# Patient Record
Sex: Male | Born: 1990 | Race: White | Hispanic: No | Marital: Single | Smoking: Never smoker
Health system: Southern US, Community
[De-identification: ages and names within clinical notes are randomized; demographics above are authoritative.]

## PROBLEM LIST (undated history)

## (undated) DIAGNOSIS — F909 Attention-deficit hyperactivity disorder, unspecified type: Secondary | ICD-10-CM

## (undated) HISTORY — DX: Attention-deficit hyperactivity disorder, unspecified type: F90.9

---

## 2014-11-13 ENCOUNTER — Other Ambulatory Visit: Payer: Self-pay | Admitting: Family Medicine

## 2014-11-13 DIAGNOSIS — Z8249 Family history of ischemic heart disease and other diseases of the circulatory system: Secondary | ICD-10-CM

## 2014-11-20 ENCOUNTER — Ambulatory Visit
Admission: RE | Admit: 2014-11-20 | Discharge: 2014-11-20 | Disposition: A | Discharge status: Home / Self Care | Payer: BLUE CROSS/BLUE SHIELD | Source: Ambulatory Visit | Attending: Family Medicine | Admitting: Family Medicine

## 2014-11-20 DIAGNOSIS — Z8249 Family history of ischemic heart disease and other diseases of the circulatory system: Secondary | ICD-10-CM

## 2014-11-22 ENCOUNTER — Ambulatory Visit
Admission: RE | Admit: 2014-11-22 | Discharge: 2014-11-22 | Disposition: A | Discharge status: Home / Self Care | Payer: BLUE CROSS/BLUE SHIELD | Source: Ambulatory Visit | Attending: Family Medicine | Admitting: Family Medicine

## 2014-11-22 DIAGNOSIS — Z8249 Family history of ischemic heart disease and other diseases of the circulatory system: Secondary | ICD-10-CM

## 2017-12-29 DIAGNOSIS — Z79899 Other long term (current) drug therapy: Secondary | ICD-10-CM | POA: Diagnosis not present

## 2018-03-28 DIAGNOSIS — Z79899 Other long term (current) drug therapy: Secondary | ICD-10-CM | POA: Diagnosis not present

## 2018-06-28 DIAGNOSIS — Z79899 Other long term (current) drug therapy: Secondary | ICD-10-CM | POA: Diagnosis not present

## 2018-10-07 DIAGNOSIS — Z79899 Other long term (current) drug therapy: Secondary | ICD-10-CM | POA: Diagnosis not present

## 2018-12-19 DIAGNOSIS — S29011A Strain of muscle and tendon of front wall of thorax, initial encounter: Secondary | ICD-10-CM | POA: Diagnosis not present

## 2019-04-03 ENCOUNTER — Ambulatory Visit (INDEPENDENT_AMBULATORY_CARE_PROVIDER_SITE_OTHER): Payer: Managed Care, Other (non HMO) | Admitting: Family Medicine

## 2019-04-03 ENCOUNTER — Encounter: Payer: Self-pay | Admitting: Family Medicine

## 2019-04-03 ENCOUNTER — Other Ambulatory Visit: Payer: Self-pay

## 2019-04-03 DIAGNOSIS — F909 Attention-deficit hyperactivity disorder, unspecified type: Secondary | ICD-10-CM | POA: Diagnosis not present

## 2019-04-03 DIAGNOSIS — F322 Major depressive disorder, single episode, severe without psychotic features: Secondary | ICD-10-CM | POA: Diagnosis not present

## 2019-04-03 MED ORDER — ESCITALOPRAM OXALATE 10 MG PO TABS: 10.0000 mg | ORAL_TABLET | Freq: Every day | ORAL | 1 refills | Status: AC

## 2019-04-03 NOTE — Progress Notes (Signed)
Virtual Visit via Video Note   I connected with Christopher Miles on 04/03/19 at  4:30 PM EDT by a video enabled telemedicine application and verified that I am speaking with the correct person using two identifiers.  Location patient: home Location provider:work office Persons participating in the virtual visit: patient, provider  I discussed the limitations of evaluation and management by telemedicine and the availability of in person appointments. The patient expressed understanding and agreed to proceed.  Chief Complaint  Patient presents with  . Establish Care  . Depression    HPI: Christopher Miles is a 28 yo male establishing care today and c/o possible depression. Former PCP: N/A Last CPE: > 3 years.  He has noted symptoms for the past 3 months but worse for the past 2 weeks. 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 He feels "horrible" he has "a feeling in my stomach, it is hard to explain." Feeling down, having trouble sleeping, "off of my routine." He feels like he is going to be 28 years old and he has not sense of achievement.  Sleeping for about 3 hours. + Fatigue and decreased appetite, he has noticed some weight loss.  Denies prior hx of depression. Hx of ADHD, he follows with Dr Elisabeth MostStevenson and currently on Vyvanse 70 mg daily. He stopped taking his ADHD medication for a few days, resumed it recently.  He lives with his girlfriend. Negative for family history of psychiatric disorders.   Depression screen PHQ 2/9 04/03/2019  Decreased Interest 3  Down, Depressed, Hopeless 3  PHQ - 2 Score 6  Altered sleeping 3  Tired, decreased energy 3  Change in appetite 3  Feeling bad or failure about yourself  3  Trouble concentrating 3  Moving slowly or fidgety/restless 3  Suicidal thoughts 0  PHQ-9 Score 24  Difficult doing work/chores Extremely dIfficult    He denies headache, visual changes, sore  throat, palpitation, chest pain, dyspnea, abdominal pain, nausea, vomiting, changes in bowel habits, tremor, or dizziness.  ROS: See pertinent positives and negatives per HPI.  Past Medical History:  Diagnosis Date  . ADHD    History reviewed. No pertinent surgical history.  Family History  Problem Relation Age of Onset  . Depression Neg Hx   . Anxiety disorder Neg Hx     Social History   Socioeconomic History  . Marital status: Single    Spouse name: Not on file  . Number of children: Not on file  . Years of education: Not on file  . Highest education level: Not on file  Occupational History  . Not on file  Social Needs  . Financial resource strain: Not on file  . Food insecurity    Worry: Not on file    Inability: Not on file  . Transportation needs    Medical: Not on file    Non-medical: Not on file  Tobacco Use  . Smoking status: Never Smoker  . Smokeless tobacco: Never Used  Substance and Sexual Activity  . Alcohol use: Not on file  . Drug use: Not Currently  . Sexual activity: Not on file  Lifestyle  . Physical activity    Days per week: Not on file    Minutes per session: Not on file  . Stress: Not on file  Relationships  . Social Musicianconnections    Talks on phone: Not on file    Gets together: Not on  file    Attends religious service: Not on file    Active member of club or organization: Not on file    Attends meetings of clubs or organizations: Not on file    Relationship status: Not on file  . Intimate partner violence    Fear of current or ex partner: Not on file    Emotionally abused: Not on file    Physically abused: Not on file    Forced sexual activity: Not on file  Other Topics Concern  . Not on file  Social History Narrative  . Not on file    Current Outpatient Medications:  .  escitalopram (LEXAPRO) 10 MG tablet, Take 1 tablet (10 mg total) by mouth daily., Disp: 30 tablet, Rfl: 1  EXAM:  VITALS per patient if  applicable:N/A  GENERAL: alert, oriented, appears well and in no acute distress  HEENT: atraumatic, normocephalic,conjunctiva clear, no obvious facial abnormalities on inspection.  NECK: normal movements of the head and neck  LUNGS: on inspection no signs of respiratory distress, breathing rate appears normal, no obvious gross SOB, gasping or wheezing  CV: no obvious cyanosis  MS: moves all visible extremities without noticeable abnormality  PSYCH/NEURO: pleasant and cooperative, no obvious depression,+ anxious. Speech and thought processing grossly intact  ASSESSMENT AND PLAN:  Discussed the following assessment and plan:   ADHD Side effects of Vyvanse discussed. He has an appt with Dr Johnnye Sima in a couple weeks.  Depression, major, single episode, severe ([place]) New problem.  After discussion of pharmacologic options as well as side effects, he agrees with trying Lexapro 10 mg daily. Also recommend psychotherapy. Clearly instructed about warning signs. Follow-up in 6 weeks, before if needed.   Face to face virtual visit 30 min. Applied PHQ-9 questionnaire  We discussed physiopathology and role that serotonin plays in the disease/symptomatology.  I discussed the assessment and treatment plan with the patient. He was provided an opportunity to ask questions and all were answered. He agreed with the plan and demonstrated an understanding of the instructions.   The patient was advised to call back or seek an in-person evaluation if the symptoms worsen or if the condition fails to improve as anticipated.  Return in about 6 weeks (around 05/15/2019) for depression.    Shynice Sigel [place], MD

## 2019-04-03 NOTE — Assessment & Plan Note (Addendum)
New problem.  After discussion of pharmacologic options as well as side effects, he agrees with trying Lexapro 10 mg daily. Also recommend psychotherapy. Clearly instructed about warning signs. Follow-up in 6 weeks, before if needed.

## 2019-04-03 NOTE — Assessment & Plan Note (Signed)
Side effects of Vyvanse discussed. He has an appt with Dr Johnnye Sima in a couple weeks.

## 2020-09-04 ENCOUNTER — Encounter: Payer: Self-pay | Admitting: Orthopaedic Surgery

## 2020-09-04 ENCOUNTER — Other Ambulatory Visit: Payer: Self-pay

## 2020-09-04 ENCOUNTER — Ambulatory Visit (INDEPENDENT_AMBULATORY_CARE_PROVIDER_SITE_OTHER): Payer: 59 | Admitting: Orthopaedic Surgery

## 2020-09-04 ENCOUNTER — Ambulatory Visit (INDEPENDENT_AMBULATORY_CARE_PROVIDER_SITE_OTHER): Payer: 59

## 2020-09-04 VITALS — Ht 71.0 in | Wt 180.0 lb

## 2020-09-04 DIAGNOSIS — G8929 Other chronic pain: Secondary | ICD-10-CM

## 2020-09-04 DIAGNOSIS — M25511 Pain in right shoulder: Secondary | ICD-10-CM

## 2020-09-04 NOTE — Progress Notes (Signed)
Office Visit Note   Patient: Christopher Miles           Date of Birth: 11-04-1990           MRN: [id number] Visit Date: 09/04/2020              Requested by: No referring provider defined for this encounter. PCP: No primary care provider on file.   Assessment & Plan: Visit Diagnoses:  1. Chronic right shoulder pain     Plan: Christopher Miles relates initial onset of right shoulder discomfort a probably 8 to 10 years ago.  He notes initial onset of pain after "digging a number of holes".  He was initially treated with steroids and told he may have a rotator cuff tear.  The pain eventually resolved but notes that he has had recurrent episodes of pain on an episodic basis.  Most of the time the pain will last a week but on this occasion its been uncomfortable for at least a month.  No recurrent injury or trauma.  He has a feeling of something "crunching" and has difficulty lifting weights sleeping and even feeling of weakness with the right upper extremity.  He is right-hand dominant.  He has not had any history of shoulder dislocation.  I suspect he might have a tear of the labrum and will order an MRI arthrogram  Follow-Up Instructions: Return After MRI arthrogram right shoulder.   Orders:  Orders Placed This Encounter  Procedures  . XR Shoulder Right  . Arthrogram  . MR SHOULDER RIGHT W CONTRAST   No orders of the defined types were placed in this encounter.     Procedures: No procedures performed   Clinical Data: No additional findings.   Subjective: Chief Complaint  Patient presents with  . Right Shoulder - Pain  Patient presents today for his right shoulder. 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 He developed pain in his shoulder then and was diagnosed with a rotator cuff tear without further diagnostic testing. He was given oral prednisone and it seemed to get better. 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 This time it has been worse and he has noticed a "crunching" in his shoulder. He has pain with putting on and off his shirt. He cannot sleep well and definitely cannot sleep on his right side. He is right hand dominant. He has tried Ibuprofen, but states that it does not help. He states that his shoulder pain has now limited his ability to work out and pull his bow back for hunting.  No history of shoulder dislocation or subluxation but does feel something occasionally "slip".  No neck pain.  No history of numbness or tingling  HPI  Review of Systems   Objective: Vital Signs: Ht 5\' 11"  (1.803 m)   Wt 180 lb (81.6 kg)   BMI 25.10 kg/m   Physical Exam Constitutional:      Appearance: He is well-developed.  Eyes:     Pupils: Pupils are equal, round, and reactive to light.  Pulmonary:     Effort: Pulmonary effort is normal.  Skin:    General: Skin is warm and dry.  Neurological:     Mental Status: He is alert and oriented to person, place, and time.  Psychiatric:        Behavior: Behavior normal.     Ortho Exam awake alert and  oriented x3.  No acute distress.  Able to actively place his right arm quickly overhead without loss of motion.  Some discomfort along the glenohumeral joint anteriorly with internal rotation.  No popping or clicking or grating.  Good grip and release.  Biceps intact.  No pain at the [facility] joint or the subacromial region.  No apprehension with motion.  No obvious atrophy.  Good grip and release  Specialty Comments:  No specialty comments available.  Imaging: XR Shoulder Right  Result Date: 09/04/2020 Films of the right shoulder taken 3 projections.  There is no evidence of ectopic calcification or acute change.  Humeral head is centered about the glenoid.  Normal space between the humeral head and the acromion.  No obvious degenerative changes at the [facility] joint.  Type I acromion.    PMFS History: Patient Active Problem List   Diagnosis  Date Noted  . Pain in right shoulder 09/04/2020  . ADHD 04/03/2019  . Depression, major, single episode, severe (HCC) 04/03/2019   Past Medical History:  Diagnosis Date  . ADHD     Family History  Problem Relation Age of Onset  . Depression Neg Hx   . Anxiety disorder Neg Hx     History reviewed. No pertinent surgical history. Social History   Occupational History  . Not on file  Tobacco Use  . Smoking status: Never Smoker  . Smokeless tobacco: Never Used  Substance and Sexual Activity  . Alcohol use: Not on file  . Drug use: Not Currently  . Sexual activity: Not on file

## 2020-09-19 ENCOUNTER — Ambulatory Visit
Admission: RE | Admit: 2020-09-19 | Discharge: 2020-09-19 | Disposition: A | Discharge status: Home / Self Care | Payer: 59 | Source: Ambulatory Visit | Attending: Orthopaedic Surgery | Admitting: Orthopaedic Surgery

## 2020-09-19 DIAGNOSIS — G8929 Other chronic pain: Secondary | ICD-10-CM

## 2020-09-19 MED ORDER — IOPAMIDOL (ISOVUE-M 200) INJECTION 41%
12.0000 mL | Freq: Once | INTRAMUSCULAR | Status: AC
  Administered 2020-09-19: 12 mL via INTRA_ARTICULAR

## 2020-10-02 ENCOUNTER — Other Ambulatory Visit: Payer: Self-pay

## 2020-10-02 ENCOUNTER — Ambulatory Visit (INDEPENDENT_AMBULATORY_CARE_PROVIDER_SITE_OTHER): Payer: 59 | Admitting: Orthopaedic Surgery

## 2020-10-02 ENCOUNTER — Encounter: Payer: Self-pay | Admitting: Orthopaedic Surgery

## 2020-10-02 VITALS — Ht 71.0 in | Wt 180.0 lb

## 2020-10-02 DIAGNOSIS — M25511 Pain in right shoulder: Secondary | ICD-10-CM | POA: Diagnosis not present

## 2020-10-02 DIAGNOSIS — G8929 Other chronic pain: Secondary | ICD-10-CM | POA: Diagnosis not present

## 2020-10-02 NOTE — Progress Notes (Signed)
Office Visit Note   Patient: Christopher Miles           Date of Birth: 10/25/1990           MRN: [id number] Visit Date: 10/02/2020              Requested by: No referring provider defined for this encounter. PCP: Patient, No Pcp Per   Assessment & Plan: Visit Diagnoses:  1. Chronic right shoulder pain     Plan: Christopher Miles had an MRI arthrogram of his right shoulder demonstrating mild tendinosis of the infra and supraspinatus tendons and mild arthritis of the [facility] joint.  He still having quite a bit of trouble with overhead activity and any lifting with a sensation of something slipping out of place.  There was no evidence of a labral tear although they noted the exam was somewhat limited because of the lack of fluid.  My concern is that he is experiencing subluxation based on his symptoms.  After much discussion I think it is worth having Christopher Miles evaluate him.  He has had compromise in episodic pain for so long that I am not sure that an injection or therapy will make that much of a difference await Dr. Diamantina Providence evaluation  Follow-Up Instructions: Return Will refer to Christopher Miles.   Orders:  Orders Placed This Encounter  Procedures  . Ambulatory referral to Orthopedic Surgery   No orders of the defined types were placed in this encounter.     Procedures: No procedures performed   Clinical Data: No additional findings.   Subjective: Chief Complaint  Patient presents with  . Right Shoulder - Follow-up    MRI review  Patient presents today for follow up on his right shoulder. He had an MRI performed on 09/19/20. He is here today to go over those results.  No change in symptoms.  Has difficulty with overhead activity and and even working out at the gym.  He has a sensation of something "coming out of place along the anterior aspect of his shoulder.  Has never had a true shoulder dislocation.  Remote injury years ago  HPI  Review of Systems   Objective: Vital Signs: Ht 5\' 11"   (1.803 m)   Wt 180 lb (81.6 kg)   BMI 25.10 kg/m   Physical Exam Constitutional:      Appearance: He is well-developed and well-nourished.  HENT:     Mouth/Throat:     Mouth: Oropharynx is clear and moist.  Eyes:     Extraocular Movements: EOM normal.     Pupils: Pupils are equal, round, and reactive to light.  Pulmonary:     Effort: Pulmonary effort is normal.  Skin:    General: Skin is warm and dry.  Neurological:     Mental Status: He is alert and oriented to person, place, and time.  Psychiatric:        Mood and Affect: Mood and affect normal.        Behavior: Behavior normal.     Ortho Exam some apprehension with external rotation and abduction right shoulder.  No localized areas of tenderness.  No pain at the [facility] joint.  Minimal impingement symptoms.  Good grip and release.  Biceps intact.  No weakness  Specialty Comments:  No specialty comments available.  Imaging: No results found.   PMFS History: Patient Active Problem List   Diagnosis Date Noted  . Pain in right shoulder 09/04/2020  . ADHD 04/03/2019  . Depression,  major, single episode, severe (HCC) 04/03/2019   Past Medical History:  Diagnosis Date  . ADHD     Family History  Problem Relation Age of Onset  . Depression Neg Hx   . Anxiety disorder Neg Hx     History reviewed. No pertinent surgical history. Social History   Occupational History  . Not on file  Tobacco Use  . Smoking status: Never Smoker  . Smokeless tobacco: Never Used  Substance and Sexual Activity  . Alcohol use: Not on file  . Drug use: Not Currently  . Sexual activity: Not on file

## 2020-10-16 ENCOUNTER — Other Ambulatory Visit: Payer: Self-pay

## 2020-10-16 ENCOUNTER — Encounter: Payer: Self-pay | Admitting: Orthopedic Surgery

## 2020-10-16 ENCOUNTER — Ambulatory Visit: Payer: 59 | Admitting: Orthopedic Surgery

## 2020-10-16 DIAGNOSIS — M25311 Other instability, right shoulder: Secondary | ICD-10-CM | POA: Diagnosis not present

## 2020-10-20 ENCOUNTER — Encounter: Payer: Self-pay | Admitting: Orthopedic Surgery

## 2020-10-20 NOTE — Progress Notes (Signed)
Office Visit Note   Patient: Christopher Miles           Date of Birth: June 23, 1991           MRN: [id number] Visit Date: 10/16/2020 Requested by: Valeria Batman, MD [address] [place],  [place] [address] PCP: Patient, No Pcp Per  Subjective: Chief Complaint  Patient presents with  . Right Shoulder - Pain    HPI: Christopher Miles is a 30 y.o. male who presents to the office complaining of history of right shoulder pain.  Patient notes worsening right shoulder pain since October when he felt that his shoulder was dislocated for 1.5 weeks after he Riches arm across his chest and heard a loud noise.  He was unable to use his right arm for the next week and a half until it reduced.  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  This does not involve a lot of physical work these days.  Most of his shoulder pain he localizes to the anterior aspect of the right shoulder with radiation along the medial clavicle.  Denies any radicular pain down the arm or shoulder blade pain or numbness/tingling.  He does note some occasional axial cervical spine pain.  He feels clicking sensation in the right shoulder and feels like it wants to sublux to the point where he has reservations about heavy lifting or exercising using the right shoulder.  He cannot sleep on the right side without feeling like it wants to come out of place.  He is not taking any medication to control his pain.  History time he likes to do woodworking and exercise.  He has seen Dr. Cleophas Dunker who ordered an MRI arthrogram of the right shoulder that showed mild tendinosis of the infra and supra with mild arthritis of the [facility] joint without significant evidence of any labral tear..                ROS: All systems reviewed are negative as they relate to the chief complaint within the history of present illness.  Patient denies fevers or chills.  Assessment & Plan: Visit Diagnoses:  1. Instability of  right shoulder joint     Plan: Patient is a 30 year old male who presents complaint of right shoulder pain.  Has history of what sounds like a dislocation event in October 2021 after reaching across his body and hearing a loud noise.  Reduced after a week and a half.  Since then he has noticed anterior shoulder pain with feelings of subluxation that depend on his right shoulder activity.  He has not been able to exercise and is afraid to lift anything with his right arm due to the symptoms.  No gross laxity on exam today and no significant rotator cuff weakness.  He does have some tendinosis noted on the MRI arthrogram that Dr. Cleophas Dunker ordered but no evidence of labral damage.  Discussed options available to patient.  Discussed shoulder instability.  At this point based on history and examination I think it is likely [name] may have some posterior instability.  Typically this can respond well to home exercise program of rotator cuff strengthening.  After discussion, plan for patient to follow-up in 8 weeks for clinical recheck with further decision on management to be made at that time.  Follow-Up Instructions: No follow-ups on file.   Orders:  No orders of the defined types were placed in this encounter.  No orders of  the defined types were placed in this encounter.     Procedures: No procedures performed   Clinical Data: No additional findings.  Objective: Vital Signs: There were no vitals taken for this visit.  Physical Exam:  Constitutional: Patient appears well-developed HEENT:  Head: Normocephalic Eyes:EOM are normal Neck: Normal range of motion Cardiovascular: Normal rate Pulmonary/chest: Effort normal Neurologic: Patient is alert Skin: Skin is warm Psychiatric: Patient has normal mood and affect  Ortho Exam: Ortho exam demonstrates right shoulder with 75 degrees external rotation, 120 degrees abduction, 175 degrees forward flexion.  This compared with the left shoulder  with 80 degrees of external rotation, 120 degrees abduction, 160 degrees forward flexion.  5/5 motor strength of supraspinatus, infraspinatus, subscapularis.  No tenderness over the axial cervical spine.  No pain with cervical spine range of motion.  5/5 motor strength of bilateral grip strength, finger abduction, pronation/supination, bicep, tricep, deltoid.  Patient has posterior laxity on the right compared to the left.  2+ versus 1+.  No anterior instability or apprehension.  Less than a centimeter sulcus sign bilaterally.  No discrete AC joint tenderness. Specialty Comments:  No specialty comments available.  Imaging: No results found.   PMFS History: Patient Active Problem List   Diagnosis Date Noted  . Pain in right shoulder 09/04/2020  . ADHD 04/03/2019  . Depression, major, single episode, severe (HCC) 04/03/2019   Past Medical History:  Diagnosis Date  . ADHD     Family History  Problem Relation Age of Onset  . Depression Neg Hx   . Anxiety disorder Neg Hx     History reviewed. No pertinent surgical history. Social History   Occupational History  . Not on file  Tobacco Use  . Smoking status: Never Smoker  . Smokeless tobacco: Never Used  Substance and Sexual Activity  . Alcohol use: Not on file  . Drug use: Not Currently  . Sexual activity: Not on file

## 2022-05-15 IMAGING — MR MR SHOULDER*R* W/CM
5 series · 38 of 40 positions shown · IV contrast (agent unspecified)
Comparison: None.

CLINICAL DATA: Right shoulder pain unable to lift weights

EXAM:
MR ARTHROGRAM OF THE right SHOULDER
TECHNIQUE: Multiplanar, multisequence MR imaging of the right shoulder was
performed following the administration of intra-articular contrast.
CONTRAST:  See Injection Documentation.

[Series 3: T1 fat-sat · axial · 4.0mm · 0.27mm/px · z∈[-34,+64]mm · 11 of 21 slices shown (1 of 3)]
[im 1/21]
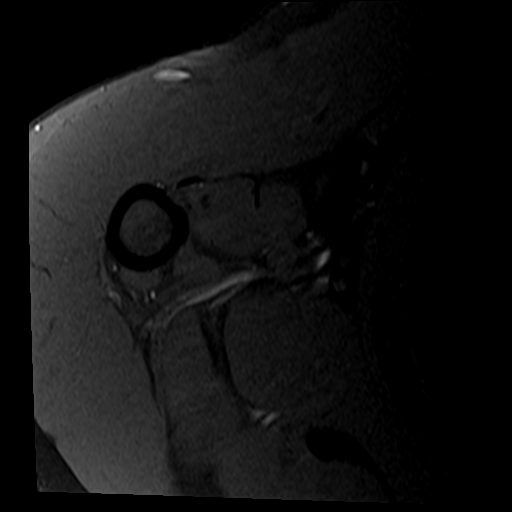
[im 3/21]
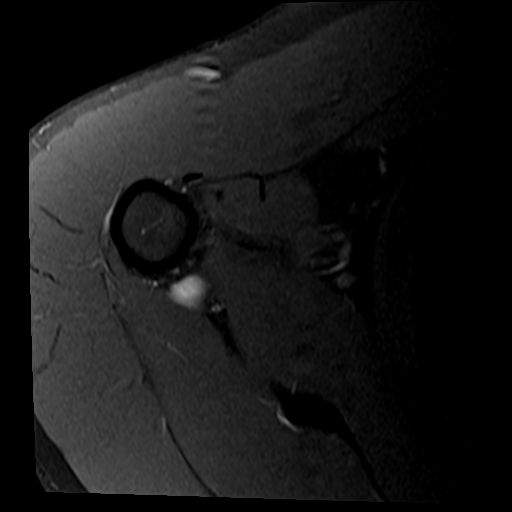
[im 5/21]
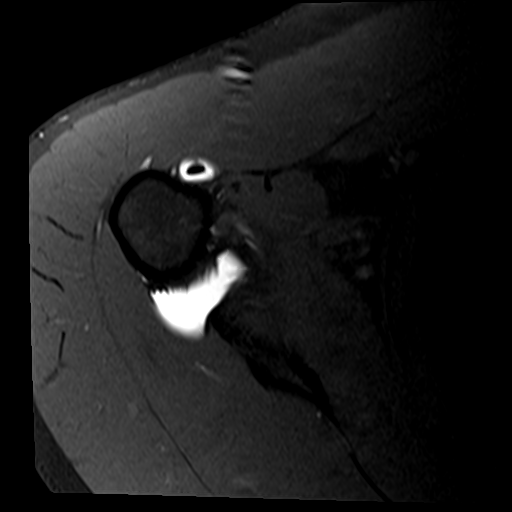
[im 7/21]
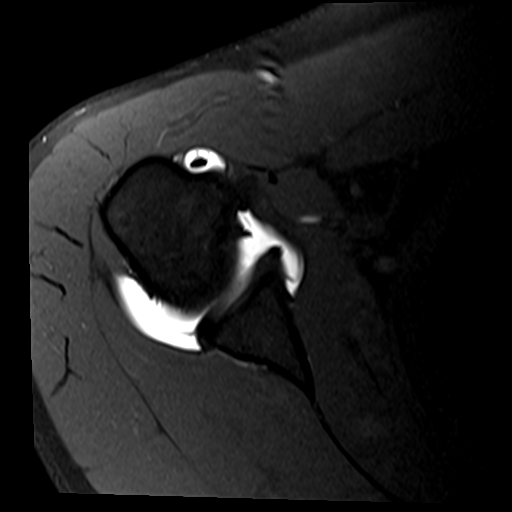
[im 9/21]
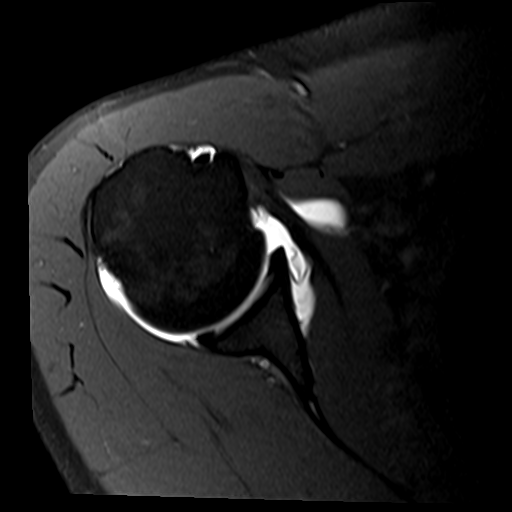
[im 11/21]
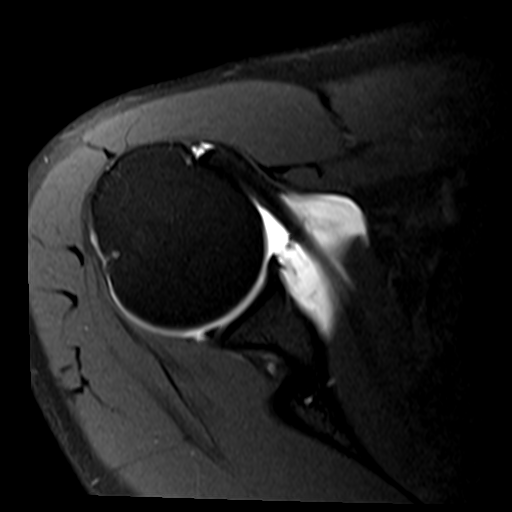
[im 13/21]
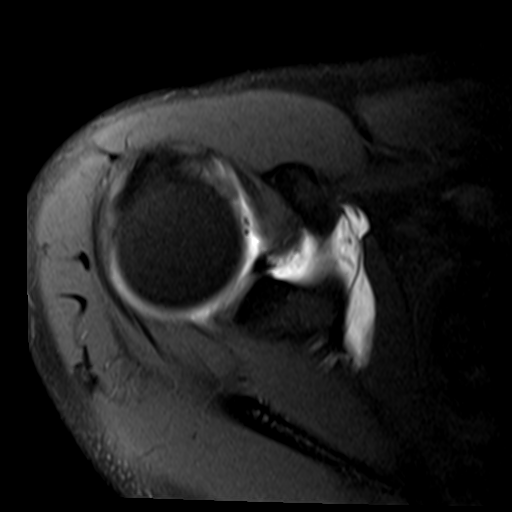
[im 15/21]
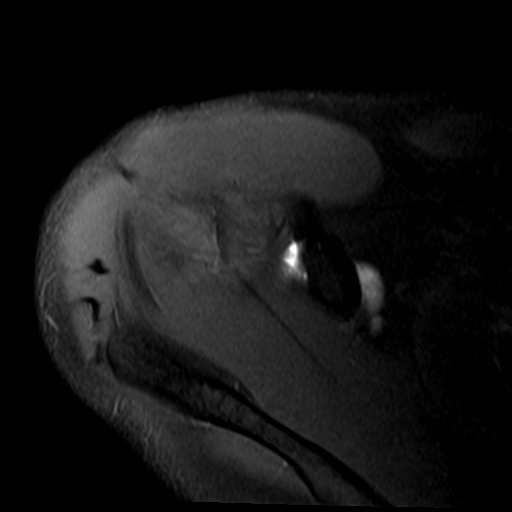
[im 17/21]
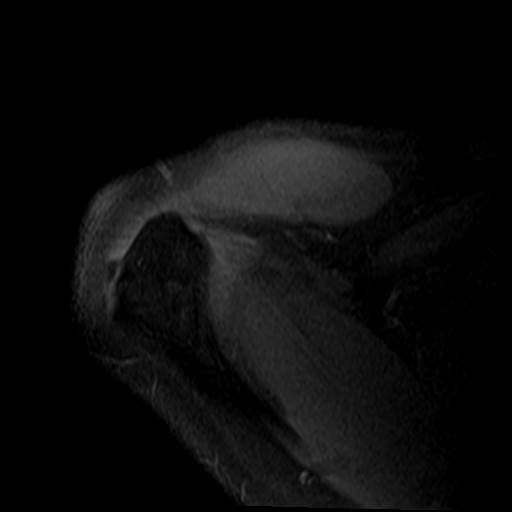
[im 19/21]
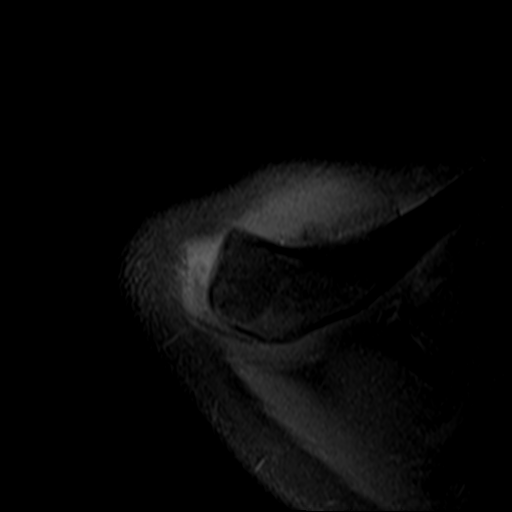
[im 21/21]
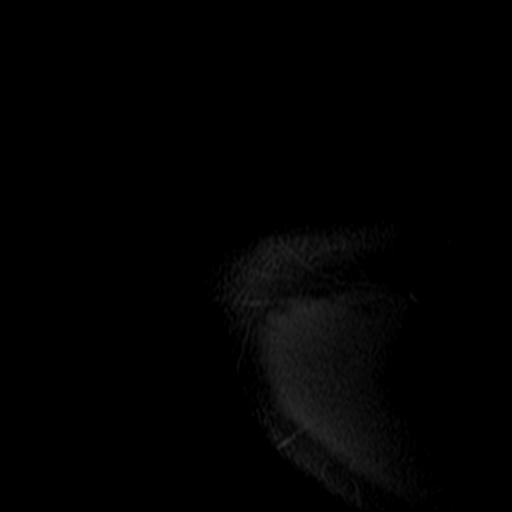

[Series 4: T2 fat-sat · coronal · 4.0mm · 0.55mm/px · 8 of 18 slices shown (1 of 2)]
[im 1/18]
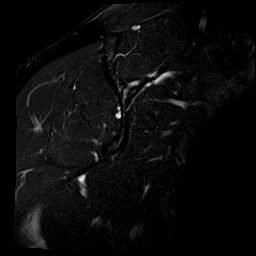
[im 3/18]
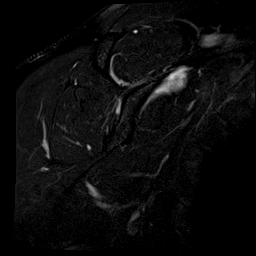
[im 5/18]
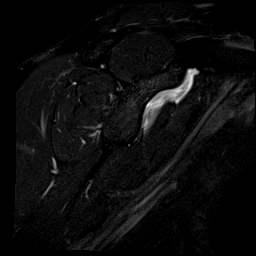
[im 8/18]
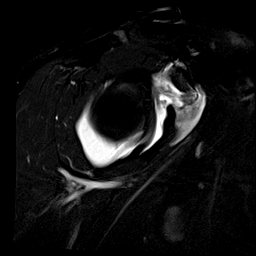
[im 10/18]
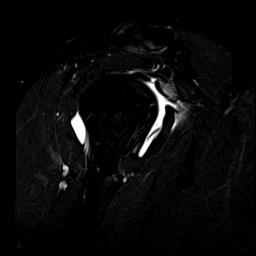
[im 13/18]
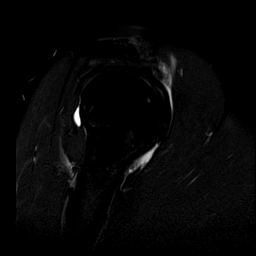
[im 15/18]
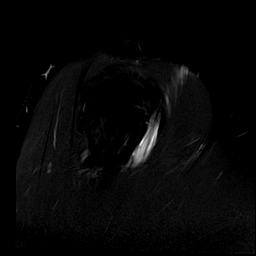
[im 18/18]
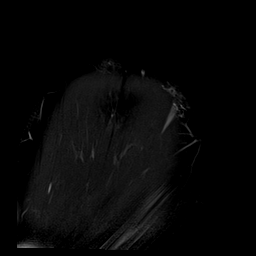

[Series 5: T1 fat-sat · sagittal · 4.0mm · 0.55mm/px · 7 of 16 slices shown (2 of 3)]
[im 1/16]
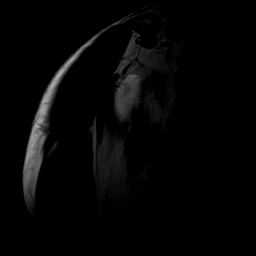
[im 3/16]
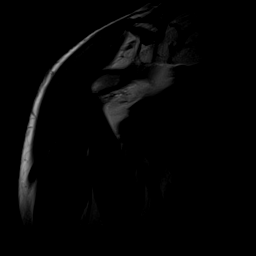
[im 6/16]
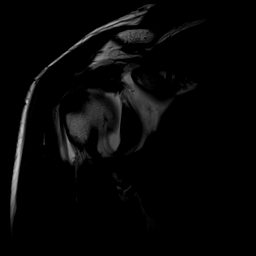
[im 8/16]
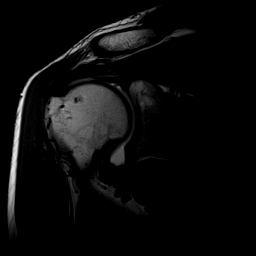
[im 11/16]
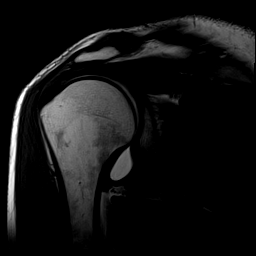
[im 13/16]
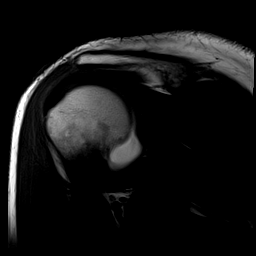
[im 16/16]
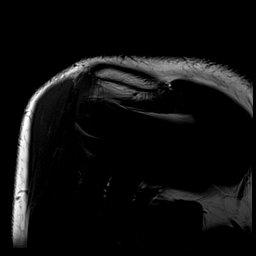

[Series 6: T1 fat-sat · sagittal · 4.0mm · 0.55mm/px · 5 of 16 slices shown (3 of 3)]
[im 1/16]
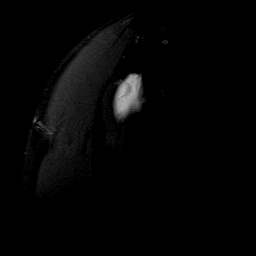
[im 3/16]
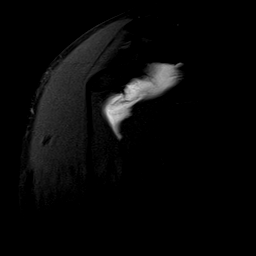
[im 6/16]
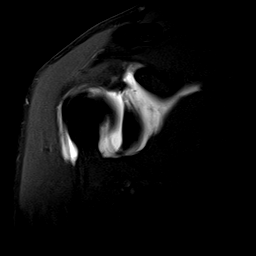
[im 8/16]
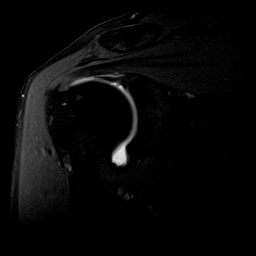
[im 11/16]
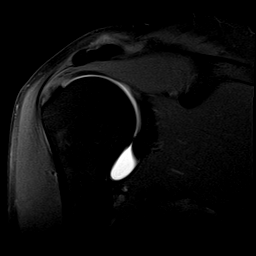

[Series 7: T2 fat-sat · sagittal · 4.0mm · 0.55mm/px · 7 of 16 slices shown (2 of 2)]
[im 1/16]
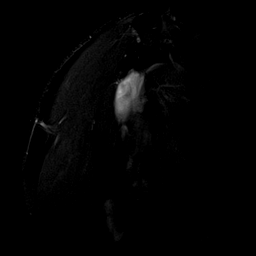
[im 3/16]
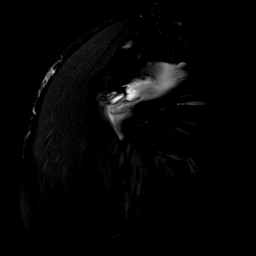
[im 6/16]
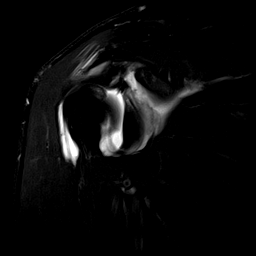
[im 8/16]
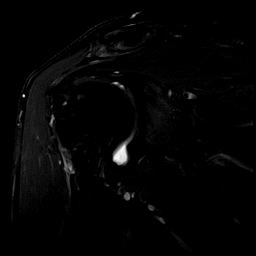
[im 11/16]
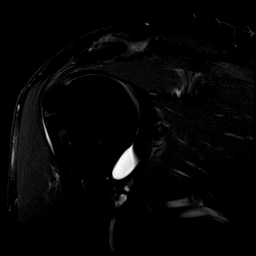
[im 13/16]
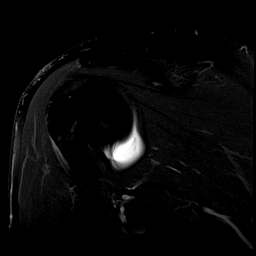
[im 16/16]
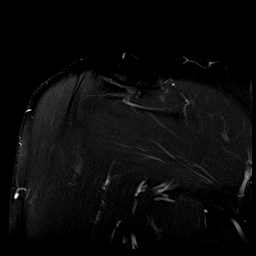

[38 of 40 positions shown; findings below may reference images not displayed]

FINDINGS: Rotator cuff: Increased intrasubstance signal and thickening seen
within the supraspinatus and superior subscapularis tendon. No
rotator cuff tears are seen. The muscles of the rotator cuff are
normal without tear, edema, or atrophy.

Muscles: The muscles other than the rotator cuff are normal without
tear, edema, or atrophy.

Biceps Long Head: The Intraarticular and extraarticular portions of
the biceps tendon are normal in position, size and signal.

Acromioclavicular Joint: Mild AC joint arthrosis seen with capsular
hypertrophy. Type II acromion.

Glenohumeral Joint: The glenohumeral joint alignment is well
maintained. The humeral head and glenoid articular cartilage are
without focal defect or significant thinning. No joint effusion.

Labrum: The glenoid labrum is grossly intact without evidence large
tear or detachment. However the evaluation is limited by lack of
intraarticular fluid.

Bones: No fracture, osteonecrosis, or pathologic marrow
infiltration.

Other: The subacromial-subdeltoid bursa is normal without evidence
of bursitis.
IMPRESSION: Mild insertional supraspinatus and subscapularis tendinosis.

Mild AC joint arthrosis.
